# Patient Record
Sex: Female | Born: 1995 | Race: White | Hispanic: No | Marital: Single | Smoking: Never smoker
Health system: Southern US, Community
[De-identification: ages and names within clinical notes are randomized; demographics above are authoritative.]

---
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
## 2014-04-12 ENCOUNTER — Emergency Department (HOSPITAL_COMMUNITY)
Admission: EM | Admit: 2014-04-12 | Discharge: 2014-04-12 | Disposition: A | Discharge status: Home / Self Care | Payer: Self-pay | Attending: Emergency Medicine | Admitting: Emergency Medicine

## 2014-04-12 ENCOUNTER — Encounter (HOSPITAL_COMMUNITY): Payer: Self-pay | Admitting: Emergency Medicine

## 2014-04-12 DIAGNOSIS — B349 Viral infection, unspecified: Secondary | ICD-10-CM

## 2014-04-12 DIAGNOSIS — R21 Rash and other nonspecific skin eruption: Secondary | ICD-10-CM | POA: Insufficient documentation

## 2014-04-12 DIAGNOSIS — R112 Nausea with vomiting, unspecified: Secondary | ICD-10-CM | POA: Insufficient documentation

## 2014-04-12 DIAGNOSIS — B9789 Other viral agents as the cause of diseases classified elsewhere: Secondary | ICD-10-CM | POA: Insufficient documentation

## 2014-04-12 MED ORDER — PREDNISONE 20 MG PO TABS: 40.0000 mg | ORAL_TABLET | Freq: Every day | ORAL | Status: AC

## 2014-04-12 NOTE — ED Notes (Signed)
Onset 1 week red raised rash over legs, arms and back.  Rash comes and goes.  Is taking Benadryl 25 mg, last dose 6:30pm. Onset last night nausea and vomiting x 5-6, last vomit @ 7:15pm, no nausea at this time.  Pt had sore throat last night but not today.  No cold/cough/fevers.  No new foods or products.

## 2014-04-12 NOTE — Discharge Instructions (Signed)
Viral Exanthems, Adult  A viral exanthem is a rash. It occurs when a type of germ (virus) infects the skin. It usually goes away on its own, without treatment.  HOME CARE   Only take medicines as told by your doctor.   Drink enough water and fluids to keep your pee (urine) clear or pale yellow.  GET HELP RIGHT AWAY IF:   You feel lumps or bumps in the neck.   Your face feels tender near the eyes and nose (sinuses).   You are not getting better after 3 days.   You have muscle aches.   You feel very tired.   You have a cough and thick spit (mucus).   You have a fever.   You have red eyes or eye pain.   You have sores in your mouth and trouble drinking or eating.   You have a sore throat with yellowish-white fluid (pus) and trouble swallowing.   You have neck pain or a stiff neck.   You get a severe headache.   You cannot stop throwing up (vomiting).  MAKE SURE YOU:   Understand these instructions.   Will watch your condition.   Will get help right away if you are not doing well or get worse.  Document Released: 03/29/2011 Document Revised: 03/05/2012 Document Reviewed: 03/29/2011  [employer] Patient Information 2014 [employer]

## 2014-04-12 NOTE — ED Notes (Signed)
Patient with rash on upper arms for last week.  Patient states she has been nauseated with the rash, but not at this time.  Patient is CAOx3.

## 2014-04-12 NOTE — ED Provider Notes (Signed)
CSN: [id number]     Arrival date & time 04/12/14  2044 History  This chart was scribed for non-physician practitioner working with Erica Quarryanielle S Ray, Erica Barnett by Elveria Risingimelie Horne, ED Scribe. This patient was seen in room TR08C/TR08C and the patient's care was started at 9:36 PM.   Chief Complaint  Patient presents with  . Rash      The history is provided by the patient and a parent. No language interpreter was used.   HPI Comments:  Erica Barnett is a 18 y.o. female brought in by parents to the Emergency Department complaining nonpainful, itchy rash on upper arms and legs, bilaterally, onset one week. Patient says the rash initially appeared like mosquito bite on her leg. However, the rash spread to her upper thigh and to her upper arms. Patient has been treating her rash with Benadryl, which does provide temporarily relief. However after the medicine wears off the rash represents new areas over the entire body including her back and face. Patient reports associated nausea and vomiting that occurred last night and again tonight which prompted her mother to bring her to ED. Patient denies fever.   Patient denies history of similar rash.  No medical issues.   History reviewed. No pertinent past medical history. History reviewed. No pertinent past surgical history. No family history on file. History  Substance Use Topics  . Smoking status: Never Smoker   . Smokeless tobacco: Not on file  . Alcohol Use: No   OB History   Grav Para Term Preterm Abortions TAB SAB Ect Mult Living                 Review of Systems  Constitutional: Negative for fever.  Gastrointestinal: Positive for nausea and vomiting.  Skin: Positive for rash.      Allergies  Review of patient's allergies indicates no known allergies.  Home Medications   Prior to Admission medications   Not on File   Triage Vitals: BP 119/63  Pulse 90  Temp(Src) 99.7 F (37.6 C) (Oral)  Resp 16  Ht 4\' 11"  (1.499 m)  Wt 115 lb 8 oz  (52.39 kg)  BMI 23.32 kg/m2  SpO2 100%  LMP 03/23/2014 Physical Exam  Nursing note and vitals reviewed. Constitutional: She is oriented to person, place, and time. She appears well-developed and well-nourished. No distress.  HENT:  Head: Normocephalic and atraumatic.  Eyes: EOM are normal.  Neck: Neck supple. No tracheal deviation present.  Cardiovascular: Normal rate, regular rhythm and normal heart sounds.  Exam reveals no gallop and no friction rub.   No murmur heard. Pulmonary/Chest: Effort normal and breath sounds normal. No respiratory distress. She has no wheezes. She has no rales. She exhibits no tenderness.  Abdominal:  No focal abdominal tenderness, no RLQ tenderness or pain at McBurney's point, no RUQ tenderness or Murphy's sign, no left-sided abdominal tenderness, no fluid wave, or signs of peritonitis   Musculoskeletal: Normal range of motion.  Neurological: She is alert and oriented to person, place, and time.  Skin: Skin is warm and dry. Rash noted.  Scattered hives, no characteristic patterns, non-painful  Psychiatric: She has a normal mood and affect. Her behavior is normal.    ED Course  Procedures (including critical care time) DIAGNOSTIC STUDIES: Oxygen Saturation is 100% on room air, normal by my interpretation.    COORDINATION OF CARE: 9:37 PM- Advised to continue taking Benadryl. Course of steroids for five days. If patient experiences fever, abdominal pain, and worsening  nausea and vomiting patient should return to ED. Discussed treatment plan with patient's parent and patient at bedside and patient agreed to plan.    Labs Review Labs Reviewed - No data to display  Imaging Review No results found.   EKG Interpretation None      MDM   Final diagnoses:  Viral syndrome  Rash    Patient with new rash.  No precipitating symptoms.  Relieved with benadryl.  No new contacts or exposures.  Works at a daycare.  Patient also reports some nausea and  vomiting tonight, but no abdominal pain.  I suspect that this is a viral syndrome and will be self limiting.  I will give the patient some prednisone and recommend continuing the benadryl.  Return precautions regarding fever, abdominal pain, and difficulty breathing have been given.  The patient is not in any apparent distress.  She is stable and ready for discharge.  I personally performed the services described in this documentation, which was scribed in my presence. The recorded information has been reviewed and is accurate.     Roxy Horsemanobert Kalyan Barabas, PA-C 04/12/14 2152

## 2014-04-13 NOTE — ED Provider Notes (Signed)
History/physical exam/procedure(s) were performed by non-physician practitioner and as supervising physician I was immediately available for consultation/collaboration. I have reviewed all notes and am in agreement with care and plan.   Hilario Quarryanielle S Bryleigh Ottaway, MD 04/13/14 [phone]
